# Patient Record
Sex: Female | Born: 1990 | Race: Black or African American | Hispanic: No | Marital: Single | Smoking: Current some day smoker
Health system: Southern US, Community
[De-identification: ages and names within clinical notes are randomized; demographics above are authoritative.]

---
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
## 2013-06-02 ENCOUNTER — Encounter: Payer: Self-pay | Admitting: Obstetrics and Gynecology

## 2014-02-01 ENCOUNTER — Emergency Department (HOSPITAL_COMMUNITY)
Admission: EM | Admit: 2014-02-01 | Discharge: 2014-02-01 | Disposition: A | Discharge status: Home / Self Care | Payer: Medicaid Other | Attending: Emergency Medicine | Admitting: Emergency Medicine

## 2014-02-01 ENCOUNTER — Encounter (HOSPITAL_COMMUNITY): Payer: Self-pay | Admitting: Emergency Medicine

## 2014-02-01 DIAGNOSIS — R1033 Periumbilical pain: Secondary | ICD-10-CM | POA: Insufficient documentation

## 2014-02-01 DIAGNOSIS — D72829 Elevated white blood cell count, unspecified: Secondary | ICD-10-CM | POA: Insufficient documentation

## 2014-02-01 DIAGNOSIS — R109 Unspecified abdominal pain: Secondary | ICD-10-CM

## 2014-02-01 DIAGNOSIS — Z3202 Encounter for pregnancy test, result negative: Secondary | ICD-10-CM | POA: Insufficient documentation

## 2014-02-01 DIAGNOSIS — R1013 Epigastric pain: Secondary | ICD-10-CM | POA: Insufficient documentation

## 2014-02-01 DIAGNOSIS — R197 Diarrhea, unspecified: Secondary | ICD-10-CM | POA: Insufficient documentation

## 2014-02-01 DIAGNOSIS — R6883 Chills (without fever): Secondary | ICD-10-CM | POA: Insufficient documentation

## 2014-02-01 DIAGNOSIS — R112 Nausea with vomiting, unspecified: Secondary | ICD-10-CM | POA: Insufficient documentation

## 2014-02-01 DIAGNOSIS — F17200 Nicotine dependence, unspecified, uncomplicated: Secondary | ICD-10-CM | POA: Insufficient documentation

## 2014-02-01 LAB — CBC WITH DIFFERENTIAL/PLATELET
Basophils Absolute: 0 10*3/uL (ref 0.0–0.1)
Basophils Relative: 0 % (ref 0–1)
EOS ABS: 0.1 10*3/uL (ref 0.0–0.7)
EOS PCT: 0 % (ref 0–5)
HEMATOCRIT: 40.9 % (ref 36.0–46.0)
HEMOGLOBIN: 14.1 g/dL (ref 12.0–15.0)
LYMPHS PCT: 8 % — AB (ref 12–46)
Lymphs Abs: 1.5 10*3/uL (ref 0.7–4.0)
MCH: 30.4 pg (ref 26.0–34.0)
MCHC: 34.5 g/dL (ref 30.0–36.0)
MCV: 88.1 fL (ref 78.0–100.0)
MONOS PCT: 4 % (ref 3–12)
Monocytes Absolute: 0.6 10*3/uL (ref 0.1–1.0)
Neutro Abs: 15.7 10*3/uL — ABNORMAL HIGH (ref 1.7–7.7)
Neutrophils Relative %: 88 % — ABNORMAL HIGH (ref 43–77)
Platelets: 284 10*3/uL (ref 150–400)
RBC: 4.64 MIL/uL (ref 3.87–5.11)
RDW: 13.6 % (ref 11.5–15.5)
WBC: 17.8 10*3/uL — AB (ref 4.0–10.5)

## 2014-02-01 LAB — URINALYSIS, ROUTINE W REFLEX MICROSCOPIC
BILIRUBIN URINE: NEGATIVE
Glucose, UA: NEGATIVE mg/dL
Hgb urine dipstick: NEGATIVE
KETONES UR: NEGATIVE mg/dL
Leukocytes, UA: NEGATIVE
NITRITE: NEGATIVE
PROTEIN: NEGATIVE mg/dL
SPECIFIC GRAVITY, URINE: 1.024 (ref 1.005–1.030)
Urobilinogen, UA: 0.2 mg/dL (ref 0.0–1.0)
pH: 6 (ref 5.0–8.0)

## 2014-02-01 LAB — COMPREHENSIVE METABOLIC PANEL
ALBUMIN: 4.3 g/dL (ref 3.5–5.2)
ALK PHOS: 64 U/L (ref 39–117)
ALT: 11 U/L (ref 0–35)
AST: 17 U/L (ref 0–37)
BILIRUBIN TOTAL: 0.4 mg/dL (ref 0.3–1.2)
BUN: 16 mg/dL (ref 6–23)
CO2: 22 mEq/L (ref 19–32)
CREATININE: 0.68 mg/dL (ref 0.50–1.10)
Calcium: 9.5 mg/dL (ref 8.4–10.5)
Chloride: 101 mEq/L (ref 96–112)
GFR calc Af Amer: >90 mL/min (ref >90)
Glucose, Bld: 83 mg/dL (ref 70–99)
POTASSIUM: 3.8 meq/L (ref 3.7–5.3)
SODIUM: 138 meq/L (ref 137–147)
Total Protein: 8.3 g/dL (ref 6.0–8.3)

## 2014-02-01 LAB — LIPASE, BLOOD: Lipase: 26 U/L (ref 11–59)

## 2014-02-01 LAB — POC URINE PREG, ED: PREG TEST UR: NEGATIVE

## 2014-02-01 MED ORDER — OXYCODONE-ACETAMINOPHEN 5-325 MG PO TABS
1.0000 | ORAL_TABLET | Freq: Once | ORAL | Status: AC
  Administered 2014-02-01: 1 via ORAL
  Filled 2014-02-01: qty 1

## 2014-02-01 MED ORDER — HYDROCODONE-ACETAMINOPHEN 5-325 MG PO TABS: 1.0000 | ORAL_TABLET | Freq: Four times a day (QID) | ORAL | Status: DC | PRN

## 2014-02-01 MED ORDER — MORPHINE SULFATE 4 MG/ML IJ SOLN
4.0000 mg | Freq: Once | INTRAMUSCULAR | Status: AC
  Administered 2014-02-01: 4 mg via INTRAVENOUS
  Filled 2014-02-01: qty 1

## 2014-02-01 NOTE — ED Notes (Signed)
Pt and grandmother given water to drink, pt is tolerating fluids.

## 2014-02-01 NOTE — ED Provider Notes (Signed)
CSN: [id number]     Arrival date & time 02/01/14  0919 History   First MD Initiated Contact with Patient 02/01/14 1116     Chief Complaint  Patient presents with  . Abdominal Pain     (Consider location/radiation/quality/duration/timing/severity/associated sxs/prior Treatment) Patient is a 23 y.o. female presenting with abdominal pain. The history is provided by the patient. No language interpreter was used.  Abdominal Pain Associated symptoms: chills, diarrhea, nausea and vomiting   Associated symptoms: no chest pain, no cough, no dysuria, no fever, no shortness of breath, no sore throat and no vaginal discharge   This is a 22yo AAF w/ no known PMH who presents with c/o intermittent epigastric abd pain that woke her up from sleep this morning at 3AM. Patient states the pain lasts about 1 minute, then goes away for 3-5 minutes, then returns again. She also has nausea and vomiting x 3 episodes and watery, nonbloody diarrhea x 3-4 episodes this morning. Endorses having chills as well. No known sick contacts. She ate out at [employer] last night for dinner. She uses ibuprofen only with her menstrual periods, LNMP Feb 25th and she has not used ibuprofen since this time. She does not take any medications daily. She drinks alcohol on occasion, about 2 glasses of wine per week.   History reviewed. No pertinent past medical history. History reviewed. No pertinent past surgical history. History reviewed. No pertinent family history. History  Substance Use Topics  . Smoking status: Current Every Day Smoker    Types: Cigarettes  . Smokeless tobacco: Not on file  . Alcohol Use: Yes   OB History   Grav Para Term Preterm Abortions TAB SAB Ect Mult Living                 Review of Systems  Constitutional: Positive for chills. Negative for fever.  HENT: Negative for sore throat.   Respiratory: Negative for cough and shortness of breath.   Cardiovascular: Negative for chest pain.  Gastrointestinal:  Positive for nausea, vomiting, abdominal pain and diarrhea. Negative for blood in stool.  Genitourinary: Negative for dysuria, flank pain and vaginal discharge.  Neurological: Negative for headaches.  All other systems reviewed and are negative.      Allergies  Review of patient's allergies indicates no known allergies.  Home Medications   Current Outpatient Rx  Name  Route  Sig  Dispense  Refill  . ibuprofen (ADVIL,MOTRIN) 200 MG tablet   Oral   Take 1,200 mg by mouth 2 (two) times daily as needed for cramping.          BP 109/49  Pulse 77  Temp(Src) 98.3 F (36.8 C) (Oral)  Resp 16  Ht 5\' 7"  (1.702 m)  Wt 153 lb 8 oz (69.627 kg)  BMI 24.04 kg/m2  SpO2 100% Physical Exam  Nursing note and vitals reviewed. Constitutional: She is oriented to person, place, and time. She appears well-developed and well-nourished. No distress.  HENT:  Head: Normocephalic and atraumatic.  Mouth/Throat: Oropharynx is clear and moist.  Eyes: Conjunctivae are normal. Pupils are equal, round, and reactive to light.  Neck: Neck supple.  Cardiovascular: Normal rate, regular rhythm and normal heart sounds.   Pulmonary/Chest: Effort normal and breath sounds normal. No respiratory distress.  Abdominal: Soft. Bowel sounds are normal. She exhibits no distension. There is tenderness.  TTP epigastric and periumbilical region  Musculoskeletal: She exhibits no edema.  Neurological: She is alert and oriented to person, place, and time. No cranial  nerve deficit.  Skin: Skin is warm and dry. She is not diaphoretic.    ED Course  Procedures (including critical care time) Labs Review Labs Reviewed  CBC WITH DIFFERENTIAL - Abnormal; Notable for the following:    WBC 17.8 (*)    Neutrophils Relative % 88 (*)    Neutro Abs 15.7 (*)    Lymphocytes Relative 8 (*)    All other components within normal limits  COMPREHENSIVE METABOLIC PANEL  LIPASE, BLOOD  URINALYSIS, ROUTINE W REFLEX MICROSCOPIC  POC  URINE PREG, ED   Imaging Review No results found.   EKG Interpretation None      MDM   This is a 22yo AAF w/ no known PMH who presents with N/V/D and abd pain found to have leukocytosis. CMP wnl. UA and UPT negative. Patient thinks she can tolerate PO as she is no longer nauseated, so will give percocet x 1 dose for her pain. I suspect patient has either acute viral gastroenteritis vs food poisoning. She has no risk factors for ileus or partial obstruction, so I doubt this is the etiology. Pancreatitis is unlikely given she does not have hx of gall stones and she is not a heavy drinker, though this is possible. Will check lipase.  1:11 PM Patient feeling much better after receiving percocet and morphine. Lipase wnl. She has been tolerating PO water. Will PO challenge and if she tolerates, will discharge.  2:07 PM Patient tolerating PO well. She has no pain or nausea currently. Will discharge her with norco, #15. She was instructed to return to ED with any new or worsening symptoms.  Windell Hummingbirdachel Starsha Morning, MD 02/01/14 1407  Windell Hummingbirdachel Letisia Schwalb, MD 02/01/14 [phone]

## 2014-02-01 NOTE — ED Provider Notes (Signed)
I saw and evaluated the patient, reviewed the resident's note and I agree with the findings and plan.   EKG Interpretation None     Patient with abdominal pain. The lab work is overall reassuring, however she does have leukocytosis. She feels much better after pain medicine. She has another contact and [address] in her that it had similar symptoms. She's given instructions to followup as needed.  Juliet RudeNathan R. Rubin PayorPickering, MD 02/01/14 1601

## 2014-02-01 NOTE — ED Notes (Addendum)
Pt reports she woke this am with upper abd pain. She states she's had constipation in the past so she thought that she was constipated but shes had 4 bowel movements and the pain continues. She also reports nausea

## 2014-02-01 NOTE — Discharge Instructions (Signed)
Viral Gastroenteritis Viral gastroenteritis is also known as stomach flu. This condition affects the stomach and intestinal tract. It can cause sudden diarrhea and vomiting. The illness typically lasts 3 to 8 days. Most people develop an immune response that eventually gets rid of the virus. While this natural response develops, the virus can make you quite ill. CAUSES  Many different viruses can cause gastroenteritis, such as rotavirus or noroviruses. You can catch one of these viruses by consuming contaminated food or water. You may also catch a virus by sharing utensils or other personal items with an infected person or by touching a contaminated surface. SYMPTOMS  The most common symptoms are diarrhea and vomiting. These problems can cause a severe loss of body fluids (dehydration) and a body salt (electrolyte) imbalance. Other symptoms may include:  Fever.  Headache.  Fatigue.  Abdominal pain. DIAGNOSIS  Your caregiver can usually diagnose viral gastroenteritis based on your symptoms and a physical exam. A stool sample may also be taken to test for the presence of viruses or other infections. TREATMENT  This illness typically goes away on its own. Treatments are aimed at rehydration. The most serious cases of viral gastroenteritis involve vomiting so severely that you are not able to keep fluids down. In these cases, fluids must be given through an intravenous line (IV). HOME CARE INSTRUCTIONS   Drink enough fluids to keep your urine clear or pale yellow. Drink small amounts of fluids frequently and increase the amounts as tolerated.  Ask your caregiver for specific rehydration instructions.  Avoid:  Foods high in sugar.  Alcohol.  Carbonated drinks.  Tobacco.  Juice.  Caffeine drinks.  Extremely hot or cold fluids.  Fatty, greasy foods.  Too much intake of anything at one time.  Dairy products until 24 to 48 hours after diarrhea stops.  You may consume probiotics.  Probiotics are active cultures of beneficial bacteria. They may lessen the amount and number of diarrheal stools in adults. Probiotics can be found in yogurt with active cultures and in supplements.  Wash your hands well to avoid spreading the virus.  Only take over-the-counter or prescription medicines for pain, discomfort, or fever as directed by your caregiver. Do not give aspirin to children. Antidiarrheal medicines are not recommended.  Ask your caregiver if you should continue to take your regular prescribed and over-the-counter medicines.  Keep all follow-up appointments as directed by your caregiver. SEEK IMMEDIATE MEDICAL CARE IF:   You are unable to keep fluids down.  You do not urinate at least once every 6 to 8 hours.  You develop shortness of breath.  You notice blood in your stool or vomit. This may look like coffee grounds.  You have abdominal pain that increases or is concentrated in one small area (localized).  You have persistent vomiting or diarrhea.  You have a fever.  The patient is a child younger than 3 months, and he or she has a fever.  The patient is a child older than 3 months, and he or she has a fever and persistent symptoms.  The patient is a child older than 3 months, and he or she has a fever and symptoms suddenly get worse.  The patient is a baby, and he or she has no tears when crying. MAKE SURE YOU:   Understand these instructions.  Will watch your condition.  Will get help right away if you are not doing well or get worse. Document Released: 11/02/2005 Document Revised: 01/25/2012 Document Reviewed: 08/19/2011   ExitCare Patient Information 2014 [employer]  

## 2014-08-09 ENCOUNTER — Encounter (HOSPITAL_COMMUNITY): Payer: Self-pay | Admitting: Emergency Medicine

## 2014-08-09 ENCOUNTER — Emergency Department (HOSPITAL_COMMUNITY)
Admission: EM | Admit: 2014-08-09 | Discharge: 2014-08-09 | Disposition: A | Discharge status: Home / Self Care | Payer: Medicaid Other | Attending: Emergency Medicine | Admitting: Emergency Medicine

## 2014-08-09 DIAGNOSIS — F17200 Nicotine dependence, unspecified, uncomplicated: Secondary | ICD-10-CM | POA: Insufficient documentation

## 2014-08-09 DIAGNOSIS — B9689 Other specified bacterial agents as the cause of diseases classified elsewhere: Secondary | ICD-10-CM

## 2014-08-09 DIAGNOSIS — Z3202 Encounter for pregnancy test, result negative: Secondary | ICD-10-CM | POA: Insufficient documentation

## 2014-08-09 DIAGNOSIS — N760 Acute vaginitis: Secondary | ICD-10-CM | POA: Insufficient documentation

## 2014-08-09 DIAGNOSIS — R300 Dysuria: Secondary | ICD-10-CM | POA: Insufficient documentation

## 2014-08-09 DIAGNOSIS — N898 Other specified noninflammatory disorders of vagina: Secondary | ICD-10-CM | POA: Insufficient documentation

## 2014-08-09 LAB — URINALYSIS, ROUTINE W REFLEX MICROSCOPIC
Bilirubin Urine: NEGATIVE
Glucose, UA: NEGATIVE mg/dL
Ketones, ur: NEGATIVE mg/dL
Nitrite: NEGATIVE
Protein, ur: NEGATIVE mg/dL
Specific Gravity, Urine: 1.013 (ref 1.005–1.030)
Urobilinogen, UA: 0.2 mg/dL (ref 0.0–1.0)
pH: 7 (ref 5.0–8.0)

## 2014-08-09 LAB — WET PREP, GENITAL
Trich, Wet Prep: NONE SEEN
Yeast Wet Prep HPF POC: NONE SEEN

## 2014-08-09 LAB — URINE MICROSCOPIC-ADD ON

## 2014-08-09 LAB — POC URINE PREG, ED: Preg Test, Ur: NEGATIVE

## 2014-08-09 MED ORDER — METRONIDAZOLE 500 MG PO TABS: 500.0000 mg | ORAL_TABLET | Freq: Two times a day (BID) | ORAL | Status: DC

## 2014-08-09 NOTE — ED Notes (Signed)
Pt sts white vaginal discharge and thinks she has yeast infection; pt sts dysuria x several days; pt sts LMP was 07/03/14

## 2014-08-09 NOTE — ED Notes (Signed)
Pt A&OX4, ambulatory at d/c with steady gait, NAd

## 2014-08-09 NOTE — ED Provider Notes (Signed)
CSN: [id number]     Arrival date & time 08/09/14  1604 History   First MD Initiated Contact with Patient 08/09/14 1941     Chief Complaint  Patient presents with  . Vaginal Discharge  . Dysuria     (Consider location/radiation/quality/duration/timing/severity/associated sxs/prior Treatment) HPI Comments: Patient presents today with a chief complaint of vaginal discharge that has been present for the past 5 days.  She reports that the discharge is whitish in color and is odorous.  She also reports itching and irritation of the external vagina.  Symptoms gradually worsening.  She suspected that she had a yeast infection and has been using Monistat without improvement.  She reports LMP was 08/03/14 and was normal.  She denies dysuria, fever, chills, nausea, vomiting, or abdominal pain.  She states that she is sexually active and does not use protection when having intercourse.    Patient is a 23 y.o. female presenting with vaginal discharge. The history is provided by the patient.  Vaginal Discharge   History reviewed. No pertinent past medical history. History reviewed. No pertinent past surgical history. History reviewed. No pertinent family history. History  Substance Use Topics  . Smoking status: Current Every Day Smoker    Types: Cigarettes  . Smokeless tobacco: Not on file  . Alcohol Use: Yes   OB History   Grav Para Term Preterm Abortions TAB SAB Ect Mult Living                 Review of Systems  Genitourinary: Positive for vaginal discharge.  All other systems reviewed and are negative.     Allergies  Review of patient's allergies indicates no known allergies.  Home Medications   Prior to Admission medications   Medication Sig Start Date End Date Taking? Authorizing Provider  ibuprofen (ADVIL,MOTRIN) 200 MG tablet Take 400 mg by mouth every 6 (six) hours as needed for moderate pain.   Yes Historical Provider, MD  miconazole (MONISTAT 1 COMBINATION PACK) kit Place 1  each vaginally once.   Yes Historical Provider, MD   BP 106/56  Pulse 67  Temp(Src) 98.2 F (36.8 C) (Oral)  Resp 16  SpO2 100% Physical Exam  Nursing note and vitals reviewed. Constitutional: She appears well-developed and well-nourished.  HENT:  Head: Normocephalic and atraumatic.  Mouth/Throat: Oropharynx is clear and moist.  Neck: Normal range of motion. Neck supple.  Cardiovascular: Normal rate, regular rhythm and normal heart sounds.   Pulmonary/Chest: Effort normal and breath sounds normal.  Abdominal: Soft. Bowel sounds are normal. There is no tenderness.  Genitourinary: Cervix exhibits no motion tenderness. Right adnexum displays no mass, no tenderness and no fullness. Left adnexum displays no mass, no tenderness and no fullness.  Small amount of dark red blood in the vaginal vault  Neurological: She is alert.  Skin: Skin is warm and dry.  Psychiatric: She has a normal mood and affect.    ED Course  Procedures (including critical care time) Labs Review Labs Reviewed  URINALYSIS, ROUTINE W REFLEX MICROSCOPIC - Abnormal; Notable for the following:    APPearance CLOUDY (*)    Hgb urine dipstick LARGE (*)    Leukocytes, UA MODERATE (*)    All other components within normal limits  URINE MICROSCOPIC-ADD ON - Abnormal; Notable for the following:    Squamous Epithelial / LPF FEW (*)    Bacteria, UA FEW (*)    Casts HYALINE CASTS (*)    All other components within normal limits  GC/CHLAMYDIA PROBE  AMP  HIV ANTIBODY (ROUTINE TESTING)  POC URINE PREG, ED    Imaging Review No results found.   EKG Interpretation None      MDM   Final diagnoses:  None   Patient presenting with vaginal discharge.  Wet prep showing clue cells consistent with BV.  GC/Chlamydia pending.  HIV pending.  Urine pregnancy negative.  No CMT or adnexal tenderness on exam.  Patient afebrile.  Patient offered prophylactic treatment for GC/Chlamydia, but she declined.  Patient discharged home  with Rx for Flagyl.  Patient instructed to refrain from alcohol while taking this.  Patient stable for discharge.  Return precautions given.    Hyman Bible, PA-C 08/09/14 2337

## 2014-08-09 NOTE — ED Notes (Signed)
Pt reports she thought she was having a yeast infection that started Sunday due to swelling, irritation and "bumpyness" and vaginal discharge. She went to get monistat on Monday but it has not helped, she states she has had irregular vaginal bleeding.

## 2014-08-10 LAB — GC/CHLAMYDIA PROBE AMP
CT PROBE, AMP APTIMA: NEGATIVE
GC Probe RNA: NEGATIVE

## 2014-08-10 LAB — HIV ANTIBODY (ROUTINE TESTING W REFLEX): HIV: NONREACTIVE

## 2014-08-10 NOTE — ED Provider Notes (Signed)
Medical screening examination/treatment/procedure(s) were performed by non-physician practitioner and as supervising physician I was immediately available for consultation/collaboration.   EKG Interpretation None       Hedy Garro, MD 08/10/14 1453 

## 2014-08-11 LAB — URINE CULTURE
COLONY COUNT: NO GROWTH
CULTURE: NO GROWTH

## 2018-05-25 ENCOUNTER — Ambulatory Visit (HOSPITAL_COMMUNITY): Admission: EM | Admit: 2018-05-25 | Discharge: 2018-05-25 | Discharge status: Absent without leave | Payer: Self-pay

## 2024-09-21 ENCOUNTER — Encounter: Payer: Self-pay | Admitting: Emergency Medicine

## 2024-09-21 ENCOUNTER — Ambulatory Visit: Admission: EM | Admit: 2024-09-21 | Discharge: 2024-09-21 | Disposition: A | Discharge status: Home / Self Care

## 2024-09-21 DIAGNOSIS — T17208A Unspecified foreign body in pharynx causing other injury, initial encounter: Secondary | ICD-10-CM

## 2024-09-21 NOTE — ED Triage Notes (Signed)
 Pt was eating a piece of fish and the bone got stuck in the back of her throat near her left tonsil.

## 2024-09-21 NOTE — ED Provider Notes (Signed)
 [facility]    CSN: [id number] Arrival date & time: 09/21/24  1824      History   Chief Complaint Chief Complaint  Patient presents with   Swallowed Foreign Body    HPI Allison Warren is a 33 y.o. female.   Patient presents today due to fishbone lodged in her throat.  Patient states that it happened just before arrival.  [place] is visible in the left tonsil.  The history is provided by the patient.  Swallowed Foreign Body    History reviewed. No pertinent past medical history.  There are no active problems to display for this patient.   History reviewed. No pertinent surgical history.  OB History   No obstetric history on file.      Home Medications    Prior to Admission medications   Medication Sig Start Date End Date Taking? Authorizing Provider  ibuprofen (ADVIL,MOTRIN) 200 MG tablet Take 400 mg by mouth every 6 (six) hours as needed for moderate pain.    [provider]  metroNIDAZOLE  (FLAGYL ) 500 MG tablet Take 1 tablet (500 mg total) by mouth 2 (two) times daily. 08/09/14   Nasario Moats, PA-C  miconazole (MONISTAT 1 COMBINATION PACK) kit Place 1 each vaginally once.    [provider]    Family History No family history on file.  Social History Social History   Tobacco Use   Smoking status: Some Days    Types: Cigarettes   Smokeless tobacco: Never  Vaping Use   Vaping status: Never Used  Substance Use Topics   Alcohol use: Yes   Drug use: No     Allergies   Patient has no known allergies.   Review of Systems Review of Systems   Physical Exam Triage Vital Signs ED Triage Vitals  Encounter Vitals Group     BP 09/21/24 1846 127/80     Girls Systolic BP Percentile --      Girls Diastolic BP Percentile --      Boys Systolic BP Percentile --      Boys Diastolic BP Percentile --      Pulse Rate 09/21/24 1846 94     Resp 09/21/24 1846 16     Temp 09/21/24 1846 98.7 F (37.1 C)     Temp Source  09/21/24 1846 Oral     SpO2 09/21/24 1846 97 %     Weight 09/21/24 1844 145 lb (65.8 kg)     Height --      Head Circumference --      Peak Flow --      Pain Score 09/21/24 1844 0     Pain Loc --      Pain Education --      Exclude from Growth Chart --    No data found.  Updated Vital Signs BP 127/80 (BP Location: Right Arm)   Pulse 94   Temp 98.7 F (37.1 C) (Oral)   Resp 16   Wt 145 lb (65.8 kg)   LMP 08/25/2024   SpO2 97%   Visual Acuity Right Eye Distance:   Left Eye Distance:   Bilateral Distance:    Right Eye Near:   Left Eye Near:    Bilateral Near:     Physical Exam Vitals and nursing note reviewed.  Constitutional:      General: She is not in acute distress.    Appearance: Normal appearance. She is not ill-appearing, toxic-appearing or diaphoretic.  HENT:     Mouth/Throat:  Comments: Foreign body (fishbone) visible in left tonsil Eyes:     General: No scleral icterus. Cardiovascular:     Rate and Rhythm: Normal rate and regular rhythm.     Heart sounds: Normal heart sounds.  Pulmonary:     Effort: Pulmonary effort is normal. No respiratory distress.     Breath sounds: Normal breath sounds. No wheezing or rhonchi.  Skin:    General: Skin is warm.  Neurological:     Mental Status: She is alert and oriented to person, place, and time.  Psychiatric:        Mood and Affect: Mood normal.        Behavior: Behavior normal.      UC Treatments / Results  Labs (all labs ordered are listed, but only abnormal results are displayed) Labs Reviewed - No data to display  EKG   Radiology No results found.  Procedures Foreign Body Removal  Date/Time: 09/21/2024 6:59 PM  Performed by: Andra Corean BROCKS, PA-C Authorized by: Andra Corean BROCKS, PA-C   Consent:    Consent obtained:  Verbal   Risks discussed:  Pain   Alternatives discussed:  No treatment Universal protocol:    Procedure explained and questions answered to patient or  proxy's satisfaction: yes     Patient identity confirmed:  Verbally with patient Location:    Location:  Mouth   Mouth location: left tonsil.   Tendon involvement:  None Pre-procedure details:    Imaging:  None   Neurovascular status: intact   Anesthesia:    Anesthesia method:  None Procedure type:    Procedure complexity:  Simple Procedure details:    Foreign bodies recovered:  1   Description:  Fishbone   Intact foreign body removal: yes   Post-procedure details:    Neurovascular status: intact     Confirmation:  No additional foreign bodies on visualization   Procedure completion:  Tolerated well, no immediate complications  (including critical care time)  Medications Ordered in UC Medications - No data to display  Initial Impression / Assessment and Plan / UC Course  I have reviewed the triage vital signs and the nursing notes.  Pertinent labs & imaging results that were available during my care of the patient were reviewed by me and considered in my medical decision making (see chart for details).     Patient upon removed from tonsil using alligator forceps.  Patient tolerated procedure without issue.  Patient felt better after procedure. Final Clinical Impressions(s) / UC Diagnoses   Final diagnoses:  Foreign body in pharynx, initial encounter   Discharge Instructions   None    ED Prescriptions   None    PDMP not reviewed this encounter.   Andra Corean BROCKS, PA-C 09/21/24 1900

## 2024-09-22 ENCOUNTER — Telehealth: Payer: Self-pay

## 2024-09-22 MED ORDER — AMOXICILLIN-POT CLAVULANATE 875-125 MG PO TABS: 1.0000 | ORAL_TABLET | Freq: Two times a day (BID) | ORAL | 0 refills | Status: AC

## 2024-09-22 NOTE — Telephone Encounter (Signed)
 Incoming call/information:   phone message, [phone] Allison Warren was seen last night and is calling regarding a possible antibiotic that was mentioned in her visit. [phone]. thanks!  Outgoing call/information reviewed:  Patient states she had to be seen yesterday due to a choking incident, this was removed, the provider mentioned that she could do an antibiotic but not given at that time. She would like this sent in if possible.  NKDA.  Pharmacy: [facility] [address] [place]   Message/Request sent to Dr. Vonna for review.  Allison Warren

## 2024-09-22 NOTE — Telephone Encounter (Signed)
 Prescription sent for 5 days of Augmentin.
